# Patient Record
Sex: Female | Born: 2000 | Race: Black or African American | Hispanic: No | Marital: Single | Smoking: Never smoker
Health system: Southern US, Community
[De-identification: ages and names within clinical notes are randomized; demographics above are authoritative.]

## PROBLEM LIST (undated history)

## (undated) DIAGNOSIS — F909 Attention-deficit hyperactivity disorder, unspecified type: Secondary | ICD-10-CM

## (undated) DIAGNOSIS — S42301A Unspecified fracture of shaft of humerus, right arm, initial encounter for closed fracture: Secondary | ICD-10-CM

## (undated) HISTORY — DX: Attention-deficit hyperactivity disorder, unspecified type: F90.9

## (undated) HISTORY — DX: Unspecified fracture of shaft of humerus, right arm, initial encounter for closed fracture: S42.301A

---
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
## 2023-12-16 ENCOUNTER — Other Ambulatory Visit: Payer: Self-pay

## 2023-12-16 ENCOUNTER — Emergency Department (HOSPITAL_COMMUNITY)
Admission: EM | Admit: 2023-12-16 | Discharge: 2023-12-16 | Disposition: A | Discharge status: Home / Self Care | Attending: Emergency Medicine | Admitting: Emergency Medicine

## 2023-12-16 ENCOUNTER — Encounter (HOSPITAL_COMMUNITY): Payer: Self-pay

## 2023-12-16 DIAGNOSIS — R21 Rash and other nonspecific skin eruption: Secondary | ICD-10-CM | POA: Insufficient documentation

## 2023-12-16 MED ORDER — DIPHENHYDRAMINE HCL 25 MG PO CAPS: 25.0000 mg | ORAL_CAPSULE | Freq: Once | ORAL | Status: DC

## 2023-12-16 MED ORDER — CETIRIZINE HCL 10 MG PO TABS: 10.0000 mg | ORAL_TABLET | Freq: Every day | ORAL | 0 refills | Status: AC

## 2023-12-16 MED ORDER — PREDNISONE 10 MG PO TABS: 10.0000 mg | ORAL_TABLET | Freq: Every day | ORAL | 0 refills | Status: AC

## 2023-12-16 NOTE — Discharge Instructions (Addendum)
 You were evaluated in the emergency room for a rash.  This is most consistent with a nonspecific dermatitis.  A prescription for cetirizine  was sent into your pharmacy.  This is similar to Benadryl  but without the sedating effects.  Prescription for prednisone  was additionally sent into pharmacy.  I recommend completing the full course of steroids and taking the cetirizine  into symptoms resolved.  Please call the number on the sheet to follow-up with a primary care doctor to ensure your symptoms are improving.

## 2023-12-16 NOTE — ED Provider Notes (Signed)
 [place] EMERGENCY DEPARTMENT AT [facility] Provider Note   CSN: [id number] Arrival date & time: 12/16/23  1237     Patient presents with: Rash    Linda Little is a 23 y.o. female with noncontributory past medical history presents with complaints of rash has been ongoing for 4 days.  Associated with pruritus, no pain.  First noticed on her hands that spread to involve her torso, face and lower extremities.  Spares the soles and palms.  No genital or mucosal lesions reported.  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  She states she has had this in the past.  She is unsure if it is a heat rash.  She has no URI symptoms.   HPI    Past Medical History:  Diagnosis Date   ADHD (attention deficit hyperactivity disorder)    2-3rd grade--she kept throwing Vyvanse behind the TV.   Right arm fracture 2012   History reviewed. No pertinent surgical history.   Prior to Admission medications   Medication Sig Start Date End Date Taking? Authorizing Provider  HYDROcodone -acetaminophen  (NORCO/VICODIN) 5-325 MG tablet Take 1 tablet by mouth every 6 (six) hours as needed. 12/05/21   Randol Simmonds, MD  ibuprofen (ADVIL,MOTRIN) 200 MG tablet Take 200 mg by mouth every 6 (six) hours as needed.    [provider]  metroNIDAZOLE  (FLAGYL ) 500 MG tablet 1 tab by mouth twice daily with food 06/10/20   Adella Norris, MD  naproxen  (NAPROSYN ) 375 MG tablet Take 1 tablet (375 mg total) by mouth 2 (two) times daily. 12/05/21   Randol Simmonds, MD  Neomycin-Bacitracin-Polymyxin (TRIPLE ANTIBIOTIC EX) Apply topically.    [provider]  ondansetron  (ZOFRAN -ODT) 8 MG disintegrating tablet Take 1 tablet (8 mg total) by mouth every 8 (eight) hours as needed for nausea or vomiting. 12/05/21   Randol Simmonds, MD  Skin Protectants, Misc. (A+D FIRST AID EX) Apply topically.    [provider]    Allergies: Patient has no known allergies.    Review of Systems  Skin:  Positive  for rash.    Updated Vital Signs BP (!) 137/99 (BP Location: Right Arm)   Pulse 60   Temp 98.4 F (36.9 C)   Resp 16   Ht 5' 2 (1.575 m)   SpO2 100%   BMI 21.95 kg/m   Physical Exam Vitals and nursing note reviewed.  Constitutional:      General: She is not in acute distress.    Appearance: She is well-developed.  HENT:     Head: Normocephalic and atraumatic.     Comments: Airway is patent without angioedema Eyes:     Conjunctiva/sclera: Conjunctivae normal.  Cardiovascular:     Rate and Rhythm: Normal rate and regular rhythm.     Heart sounds: No murmur heard. Pulmonary:     Effort: Pulmonary effort is normal. No respiratory distress.     Breath sounds: Normal breath sounds. No stridor.  Musculoskeletal:        General: No swelling.     Cervical back: Neck supple.  Skin:    General: Skin is warm and dry.     Capillary Refill: Capillary refill takes less than 2 seconds.     Comments: Diffuse skin colored papular rash involving extremities, torso and face. papules are minute and numerous, nonerythematous, no drainage, nontender, no mucosal lesions, spares the palms and soles  Neurological:     Mental Status: She is alert.  Psychiatric:  Mood and Affect: Mood normal.     (all labs ordered are listed, but only abnormal results are displayed) Labs Reviewed - No data to display  EKG: None  Radiology: No results found.   Procedures   Medications Ordered in the ED - No data to display                                  Medical Decision Making  This patient presents to the ED with chief complaint(s) of rash.  The complaint involves an extensive differential diagnosis and also carries with it a high risk of complications and morbidity.   Pertinent past medical history as listed in HPI  The differential diagnosis includes  Dermatitis, scabies, cellulitis, syphilis, viral prodrome, drug reaction Additional history obtained: Records reviewed Care  Everywhere/External Records  Assessment and management:   Hemodynamically stable patient presenting with rash x 4 days.  Rash has not worsened but has persisted.  On exam patient has numerous minute skin colored papules in clusters, involving extremities, torso and face, sparing the palms and soles.  No genital lesions reported.  No mucosal lesions noted on exam.  No erythema or drainage.  No tenderness.  Is associated with pruritus, no pain.  No new medications or cleaning products.  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  She has no URI symptoms.  She states she has had this in the past and is curious whether this is a heat rash.  Overall her exam is most concerning for dermatitis.  She has had no significant relief with topical steroids and topical Benadryl .  Her airway is patent, no angioedema, urticaria or stridor.  Lung sounds are clear.  Given systemic nature we will escalate to p.o. Benadryl  and steroids will provide PCP follow-up.  Patient agreeable to plan.  Independent ECG interpretation:  none  Independent labs interpretation:  The following labs were independently interpreted:  none  Independent visualization and interpretation of imaging: I independently visualized the following imaging with scope of interpretation limited to determining acute life threatening conditions related to emergency care: none    Consultations obtained:   none  Disposition:   Patient will be discharged home. The patient has been appropriately medically screened and/or stabilized in the ED. I have low suspicion for any other emergent medical condition which would require further screening, evaluation or treatment in the ED or require inpatient management. At time of discharge the patient is hemodynamically stable and in no acute distress. I have discussed work-up results and diagnosis with patient and answered all questions. Patient is agreeable with discharge plan. We discussed  strict return precautions for returning to the emergency department and they verbalized understanding.     Social Determinants of Health:   Patient's impaired access to primary care  increases the complexity of managing their presentation  This note was dictated with voice recognition software.  Despite best efforts at proofreading, errors may have occurred which can change the documentation meaning.       Final diagnoses:  None    ED Discharge Orders     None          Latausha Flamm H, PA-C 12/16/23 1335    Tegeler, Lonni PARAS, MD 12/16/23 2038

## 2023-12-16 NOTE — ED Triage Notes (Signed)
 Pt came in via POV d/t a rash on her Lt hand that started Tuesday on her Lt hand. Does endorse itching, no burning. Now the small bumps have spread to her back, face, bil arms/legs. A/Ox4, denies pain.

## 2023-12-16 NOTE — ED Notes (Signed)
 Pt eloped without discharge papers or medication.
